# Patient Record
Sex: Female | Born: 2000 | Hispanic: Yes | Marital: Single | Smoking: Never smoker
Health system: Southern US, Community
[De-identification: ages and names within clinical notes are randomized; demographics above are authoritative.]

## PROBLEM LIST (undated history)

## (undated) DIAGNOSIS — D696 Thrombocytopenia, unspecified: Secondary | ICD-10-CM

## (undated) DIAGNOSIS — R51 Headache: Secondary | ICD-10-CM

## (undated) HISTORY — PX: NO PAST SURGERIES: SHX2092

## (undated) HISTORY — DX: Headache: R51

---
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
## 2020-09-02 ENCOUNTER — Ambulatory Visit (INDEPENDENT_AMBULATORY_CARE_PROVIDER_SITE_OTHER): Payer: Medicaid Other | Admitting: Family

## 2020-09-02 ENCOUNTER — Encounter: Payer: Self-pay | Admitting: Family

## 2020-09-02 ENCOUNTER — Other Ambulatory Visit (HOSPITAL_COMMUNITY)
Admission: RE | Admit: 2020-09-02 | Discharge: 2020-09-02 | Disposition: A | Discharge status: Home / Self Care | Payer: Medicaid Other | Source: Ambulatory Visit | Attending: Family | Admitting: Family

## 2020-09-02 ENCOUNTER — Other Ambulatory Visit: Payer: Self-pay

## 2020-09-02 VITALS — BP 108/73 | HR 76 | Wt 147.0 lb

## 2020-09-02 DIAGNOSIS — Z3009 Encounter for other general counseling and advice on contraception: Secondary | ICD-10-CM | POA: Insufficient documentation

## 2020-09-02 DIAGNOSIS — Z3201 Encounter for pregnancy test, result positive: Secondary | ICD-10-CM

## 2020-09-02 DIAGNOSIS — R102 Pelvic and perineal pain: Secondary | ICD-10-CM

## 2020-09-02 LAB — POCT URINALYSIS DIP (DEVICE)
Bilirubin Urine: NEGATIVE
Glucose, UA: NEGATIVE mg/dL
Hgb urine dipstick: NEGATIVE
Ketones, ur: NEGATIVE mg/dL
Leukocytes,Ua: NEGATIVE
Nitrite: NEGATIVE
Protein, ur: NEGATIVE mg/dL
Specific Gravity, Urine: >=1.03 (ref 1.005–1.030)
Urobilinogen, UA: 0.2 mg/dL (ref 0.0–1.0)
pH: 6 (ref 5.0–8.0)

## 2020-09-02 LAB — POCT PREGNANCY, URINE: Preg Test, Ur: NEGATIVE

## 2020-09-02 MED ORDER — NORETHIN ACE-ETH ESTRAD-FE 1-20 MG-MCG PO TABS: 1.0000 | ORAL_TABLET | Freq: Every day | ORAL | 11 refills | Status: AC

## 2020-09-02 NOTE — Progress Notes (Signed)
History:  Breanna Delgado is a 20 y.o. No obstetric history on file. who presents to clinic today for dysuria x 1 week.  Sexually active x 8 months with same partner.  Denies abnormal vaginal discharge, itching, or odor.  Desires oral contraception after review of the the various methods.  No history of screening for STIs.  Declined HIV.    The following portions of the patient's history were reviewed and updated as appropriate: allergies, current medications, family history, past medical history, social history, past surgical history and problem list.  Review of Systems:  Review of Systems  Constitutional:  Negative for fever.  Gastrointestinal:  Negative for nausea and vomiting.  Genitourinary:  Positive for dysuria and urgency. Negative for vaginal bleeding, vaginal discharge and vaginal pain.      Objective:  Physical Exam BP 108/73   Pulse 76   Wt 147 lb (66.7 kg)   LMP 08/15/2020   BMI 26.89 kg/m  Physical Exam Constitutional:      Appearance: She is well-developed.  HENT:     Head: Normocephalic.  Pulmonary:     Effort: Pulmonary effort is normal.  Abdominal:     Palpations: Abdomen is soft. There is no mass.     Tenderness: There is no abdominal tenderness. There is no guarding.  Genitourinary:    Vagina: No vaginal discharge.  Musculoskeletal:     Cervical back: Normal range of motion and neck supple.  Skin:    General: Skin is warm and dry.  Neurological:     Mental Status: She is alert and oriented to person, place, and time.     Deep Tendon Reflexes: Reflexes are normal and symmetric.     Labs and Imaging Results for orders placed or performed in visit on 09/02/20 (from the past 24 hour(s))  POCT urinalysis dip (device)     Status: None   Collection Time: 09/02/20  9:09 AM  Result Value Ref Range   Glucose, UA NEGATIVE NEGATIVE mg/dL   Bilirubin Urine NEGATIVE NEGATIVE   Ketones, ur NEGATIVE NEGATIVE mg/dL   Specific Gravity, Urine >=1.030  1.005 - 1.030   Hgb urine dipstick NEGATIVE NEGATIVE   pH 6.0 5.0 - 8.0   Protein, ur NEGATIVE NEGATIVE mg/dL   Urobilinogen, UA 0.2 0.0 - 1.0 mg/dL   Nitrite NEGATIVE NEGATIVE   Leukocytes,Ua NEGATIVE NEGATIVE  Pregnancy, urine POC     Status: None   Collection Time: 09/02/20  9:12 AM  Result Value Ref Range   Preg Test, Ur NEGATIVE NEGATIVE    No results found.   Assessment & Plan:  1. Pelvic pain - Cervicovaginal ancillary only( [place]) - UA negative  2. Family planning counseling - Reviewed PISC and methods of contraception including side effects - norethindrone-ethinyl estradiol-FE (JUNEL FE 1/20) 1-20 MG-MCG tablet; Take 1 tablet by mouth daily.  Dispense: 28 tablet; Refill: 11 - Advised back-up method   Amedeo Gory, CNM 09/02/2020 9:36 AM

## 2020-09-04 LAB — CERVICOVAGINAL ANCILLARY ONLY
Bacterial Vaginitis (gardnerella): NEGATIVE
Candida Glabrata: NEGATIVE
Candida Vaginitis: NEGATIVE
Chlamydia: NEGATIVE
Comment: NEGATIVE
Comment: NEGATIVE
Comment: NEGATIVE
Comment: NEGATIVE
Comment: NEGATIVE
Comment: NORMAL
Neisseria Gonorrhea: NEGATIVE
Trichomonas: NEGATIVE

## 2020-09-04 NOTE — Progress Notes (Signed)
All labs normal.  No further action needed.  Please reach out if you have any additional questions.

## 2022-06-29 ENCOUNTER — Ambulatory Visit: Payer: 59 | Admitting: Family Medicine

## 2022-06-29 ENCOUNTER — Ambulatory Visit: Payer: Self-pay

## 2022-06-29 ENCOUNTER — Encounter: Payer: Self-pay | Admitting: Family Medicine

## 2022-06-29 VITALS — BP 111/63 | Ht 62.0 in | Wt 150.0 lb

## 2022-06-29 DIAGNOSIS — M25572 Pain in left ankle and joints of left foot: Secondary | ICD-10-CM

## 2022-06-29 DIAGNOSIS — M25571 Pain in right ankle and joints of right foot: Secondary | ICD-10-CM | POA: Diagnosis not present

## 2022-06-29 MED ORDER — KETOROLAC TROMETHAMINE 60 MG/2ML IM SOLN
60.0000 mg | Freq: Once | INTRAMUSCULAR | Status: AC
  Administered 2022-06-29: 60 mg via INTRAMUSCULAR

## 2022-06-29 NOTE — Patient Instructions (Signed)
[facility] [address] [phone]  Dr Marton Redwood Althea Charon

## 2022-06-29 NOTE — Assessment & Plan Note (Signed)
Patient is clinical and ultrasound evidence of possible medial lateral malleolus fracture.  At her mother's request she was sent to orthopedics for x-rays.  Referral was placed today.  She was placed in a cam walker boot with an Ace bandage and given crutches for ambulation, recommend she not weight-bear until she has her x-rays and they have been reviewed by Ortho.  She was also given 60 mg of Toradol IM to help with her pain.  Follow-up with orthopedics.

## 2022-06-29 NOTE — Progress Notes (Signed)
[facility]: Attending Note: I have examined the patient, reviewed the chart, discussed the assessment and plan with the Sports Medicine Fellow. I agree with assessment and treatment plan as detailed in the Fellow's note. Likely  ankle fracture. NWB w crutches, we have arranged appt with ortho this afternoon.  Discussed at length with her and her Mom.  We have sent copy of [place] pics to Dr. Althea Charon, placed her in boot, crutches. Explaioned US findings and our concern for fracture of both bones of lower leg. She is also having some mild pain right ankle but can walk on it (likely sprain), no point tenderness.

## 2022-06-29 NOTE — Progress Notes (Signed)
    New Patient Office Visit  Subjective   Patient ID: Breanna Delgado, female    DOB: 2000-07-04  Age: 22 y.o. MRN: [id number]  Left ankle pain.  Patient is here today with chief complaint of left ankle pain after she fell down the stairs this morning around 8 AM.  She reports she slipped and missed a step and had a twisting injury of her ankle.  She has not been able to bear weight since the injury this morning.  She put some ice on her ankle and took some naproxen and then presented here.  She is here today hobbling using a wheelchair to get into the building.  She is quite tearful and uncomfortable during the exam.  She has injured this ankle in the past but not any fractures.   ROS as listed above in HPI    Objective:     BP 111/63   Ht 5\' 2"  (1.575 m)   Wt 150 lb (68 kg)   BMI 27.44 kg/m   Physical Exam Vitals reviewed.  Constitutional:      General: She is not in acute distress.    Appearance: Normal appearance. She is not ill-appearing, toxic-appearing or diaphoretic.     Comments: Appears uncomfortable and tearful throughout exam  Pulmonary:     Effort: Pulmonary effort is normal.  Musculoskeletal:        General: Swelling (Medial and lateral ankle), tenderness and signs of injury present.  Skin:    General: Skin is warm.  Neurological:     Mental Status: She is alert.   Left ankle: She has some obvious swelling around her lateral ankle.  Tenderness to palpation of the lateral malleolus, medial malleolus and anterior ankle.  No tenderness to palpation of the base of the fifth.  Decreased range of motion secondary to pain with plantarflexion, dorsiflexion inversion and eversion.  Dorsalis pedis 2+.  Limited ultrasound: Left ankle She has some cortical irregularity seen in both long axis and short axis of her medial and lateral malleolus.  Concern for fracture    Assessment & Plan:   Problem List Items Addressed This Visit       Other   Pain in joint,  ankle and foot - Primary    Patient is clinical and ultrasound evidence of possible medial lateral malleolus fracture.  At her mother's request she was sent to orthopedics for x-rays.  Referral was placed today.  She was placed in a cam walker boot with an Ace bandage and given crutches for ambulation, recommend she not weight-bear until she has her x-rays and they have been reviewed by Ortho.  She was also given 60 mg of Toradol IM to help with her pain.  Follow-up with orthopedics.      Relevant Orders   DG Ankle Complete Right   [place] LIMITED JOINT SPACE STRUCTURES LOW LEFT   DG Ankle Complete Left    Return if symptoms worsen or fail to improve.    Claudie Leach, DO

## 2022-07-02 DIAGNOSIS — S82842A Displaced bimalleolar fracture of left lower leg, initial encounter for closed fracture: Secondary | ICD-10-CM | POA: Diagnosis not present

## 2022-07-02 DIAGNOSIS — M25571 Pain in right ankle and joints of right foot: Secondary | ICD-10-CM | POA: Diagnosis not present

## 2022-07-12 DIAGNOSIS — X58XXXA Exposure to other specified factors, initial encounter: Secondary | ICD-10-CM | POA: Diagnosis not present

## 2022-07-12 DIAGNOSIS — S82852A Displaced trimalleolar fracture of left lower leg, initial encounter for closed fracture: Secondary | ICD-10-CM | POA: Diagnosis not present

## 2022-07-12 DIAGNOSIS — Y999 Unspecified external cause status: Secondary | ICD-10-CM | POA: Diagnosis not present

## 2022-07-30 DIAGNOSIS — S82852A Displaced trimalleolar fracture of left lower leg, initial encounter for closed fracture: Secondary | ICD-10-CM | POA: Diagnosis not present

## 2022-08-27 DIAGNOSIS — S82852A Displaced trimalleolar fracture of left lower leg, initial encounter for closed fracture: Secondary | ICD-10-CM | POA: Diagnosis not present

## 2022-08-30 DIAGNOSIS — M25672 Stiffness of left ankle, not elsewhere classified: Secondary | ICD-10-CM | POA: Diagnosis not present

## 2022-08-30 DIAGNOSIS — S82852D Displaced trimalleolar fracture of left lower leg, subsequent encounter for closed fracture with routine healing: Secondary | ICD-10-CM | POA: Diagnosis not present

## 2022-09-04 DIAGNOSIS — H1045 Other chronic allergic conjunctivitis: Secondary | ICD-10-CM | POA: Diagnosis not present

## 2022-09-04 DIAGNOSIS — H5213 Myopia, bilateral: Secondary | ICD-10-CM | POA: Diagnosis not present

## 2022-09-04 DIAGNOSIS — H0288B Meibomian gland dysfunction left eye, upper and lower eyelids: Secondary | ICD-10-CM | POA: Diagnosis not present

## 2022-09-04 DIAGNOSIS — H0288A Meibomian gland dysfunction right eye, upper and lower eyelids: Secondary | ICD-10-CM | POA: Diagnosis not present

## 2022-09-05 DIAGNOSIS — M25672 Stiffness of left ankle, not elsewhere classified: Secondary | ICD-10-CM | POA: Diagnosis not present

## 2022-09-05 DIAGNOSIS — S82852D Displaced trimalleolar fracture of left lower leg, subsequent encounter for closed fracture with routine healing: Secondary | ICD-10-CM | POA: Diagnosis not present

## 2022-09-06 DIAGNOSIS — M25672 Stiffness of left ankle, not elsewhere classified: Secondary | ICD-10-CM | POA: Diagnosis not present

## 2022-09-06 DIAGNOSIS — S82852D Displaced trimalleolar fracture of left lower leg, subsequent encounter for closed fracture with routine healing: Secondary | ICD-10-CM | POA: Diagnosis not present

## 2022-09-10 DIAGNOSIS — M25672 Stiffness of left ankle, not elsewhere classified: Secondary | ICD-10-CM | POA: Diagnosis not present

## 2022-09-10 DIAGNOSIS — S82852D Displaced trimalleolar fracture of left lower leg, subsequent encounter for closed fracture with routine healing: Secondary | ICD-10-CM | POA: Diagnosis not present

## 2022-09-12 DIAGNOSIS — S82852D Displaced trimalleolar fracture of left lower leg, subsequent encounter for closed fracture with routine healing: Secondary | ICD-10-CM | POA: Diagnosis not present

## 2022-09-12 DIAGNOSIS — M25672 Stiffness of left ankle, not elsewhere classified: Secondary | ICD-10-CM | POA: Diagnosis not present

## 2022-09-19 DIAGNOSIS — M25672 Stiffness of left ankle, not elsewhere classified: Secondary | ICD-10-CM | POA: Diagnosis not present

## 2022-09-19 DIAGNOSIS — S82852D Displaced trimalleolar fracture of left lower leg, subsequent encounter for closed fracture with routine healing: Secondary | ICD-10-CM | POA: Diagnosis not present

## 2022-09-20 DIAGNOSIS — S82852D Displaced trimalleolar fracture of left lower leg, subsequent encounter for closed fracture with routine healing: Secondary | ICD-10-CM | POA: Diagnosis not present

## 2022-09-20 DIAGNOSIS — M25672 Stiffness of left ankle, not elsewhere classified: Secondary | ICD-10-CM | POA: Diagnosis not present

## 2022-09-24 DIAGNOSIS — S82852D Displaced trimalleolar fracture of left lower leg, subsequent encounter for closed fracture with routine healing: Secondary | ICD-10-CM | POA: Diagnosis not present

## 2022-09-24 DIAGNOSIS — M25672 Stiffness of left ankle, not elsewhere classified: Secondary | ICD-10-CM | POA: Diagnosis not present

## 2022-09-26 DIAGNOSIS — S82852D Displaced trimalleolar fracture of left lower leg, subsequent encounter for closed fracture with routine healing: Secondary | ICD-10-CM | POA: Diagnosis not present

## 2022-09-26 DIAGNOSIS — M25672 Stiffness of left ankle, not elsewhere classified: Secondary | ICD-10-CM | POA: Diagnosis not present

## 2022-10-08 DIAGNOSIS — M25572 Pain in left ankle and joints of left foot: Secondary | ICD-10-CM | POA: Diagnosis not present

## 2022-10-08 DIAGNOSIS — S82842A Displaced bimalleolar fracture of left lower leg, initial encounter for closed fracture: Secondary | ICD-10-CM | POA: Diagnosis not present

## 2022-10-10 DIAGNOSIS — S82852D Displaced trimalleolar fracture of left lower leg, subsequent encounter for closed fracture with routine healing: Secondary | ICD-10-CM | POA: Diagnosis not present

## 2022-10-10 DIAGNOSIS — M25672 Stiffness of left ankle, not elsewhere classified: Secondary | ICD-10-CM | POA: Diagnosis not present

## 2022-10-12 DIAGNOSIS — M25672 Stiffness of left ankle, not elsewhere classified: Secondary | ICD-10-CM | POA: Diagnosis not present

## 2022-10-12 DIAGNOSIS — S82852D Displaced trimalleolar fracture of left lower leg, subsequent encounter for closed fracture with routine healing: Secondary | ICD-10-CM | POA: Diagnosis not present

## 2022-10-16 DIAGNOSIS — M25672 Stiffness of left ankle, not elsewhere classified: Secondary | ICD-10-CM | POA: Diagnosis not present

## 2022-10-16 DIAGNOSIS — S82852D Displaced trimalleolar fracture of left lower leg, subsequent encounter for closed fracture with routine healing: Secondary | ICD-10-CM | POA: Diagnosis not present

## 2022-10-18 DIAGNOSIS — M25672 Stiffness of left ankle, not elsewhere classified: Secondary | ICD-10-CM | POA: Diagnosis not present

## 2022-10-18 DIAGNOSIS — S82852D Displaced trimalleolar fracture of left lower leg, subsequent encounter for closed fracture with routine healing: Secondary | ICD-10-CM | POA: Diagnosis not present

## 2022-11-14 DIAGNOSIS — M25672 Stiffness of left ankle, not elsewhere classified: Secondary | ICD-10-CM | POA: Diagnosis not present

## 2022-11-14 DIAGNOSIS — S82852D Displaced trimalleolar fracture of left lower leg, subsequent encounter for closed fracture with routine healing: Secondary | ICD-10-CM | POA: Diagnosis not present

## 2022-11-20 DIAGNOSIS — S82852D Displaced trimalleolar fracture of left lower leg, subsequent encounter for closed fracture with routine healing: Secondary | ICD-10-CM | POA: Diagnosis not present

## 2022-11-20 DIAGNOSIS — M25672 Stiffness of left ankle, not elsewhere classified: Secondary | ICD-10-CM | POA: Diagnosis not present

## 2022-11-26 DIAGNOSIS — T8484XA Pain due to internal orthopedic prosthetic devices, implants and grafts, initial encounter: Secondary | ICD-10-CM | POA: Diagnosis not present

## 2022-11-26 DIAGNOSIS — M25672 Stiffness of left ankle, not elsewhere classified: Secondary | ICD-10-CM | POA: Diagnosis not present

## 2022-11-27 DIAGNOSIS — M25672 Stiffness of left ankle, not elsewhere classified: Secondary | ICD-10-CM | POA: Diagnosis not present

## 2022-11-27 DIAGNOSIS — S82852D Displaced trimalleolar fracture of left lower leg, subsequent encounter for closed fracture with routine healing: Secondary | ICD-10-CM | POA: Diagnosis not present

## 2022-12-13 DIAGNOSIS — M25672 Stiffness of left ankle, not elsewhere classified: Secondary | ICD-10-CM | POA: Diagnosis not present

## 2022-12-13 DIAGNOSIS — S82852D Displaced trimalleolar fracture of left lower leg, subsequent encounter for closed fracture with routine healing: Secondary | ICD-10-CM | POA: Diagnosis not present

## 2022-12-21 DIAGNOSIS — M25672 Stiffness of left ankle, not elsewhere classified: Secondary | ICD-10-CM | POA: Diagnosis not present

## 2022-12-21 DIAGNOSIS — S82852D Displaced trimalleolar fracture of left lower leg, subsequent encounter for closed fracture with routine healing: Secondary | ICD-10-CM | POA: Diagnosis not present

## 2022-12-26 DIAGNOSIS — M25672 Stiffness of left ankle, not elsewhere classified: Secondary | ICD-10-CM | POA: Diagnosis not present

## 2023-01-03 DIAGNOSIS — S82852D Displaced trimalleolar fracture of left lower leg, subsequent encounter for closed fracture with routine healing: Secondary | ICD-10-CM | POA: Diagnosis not present

## 2023-01-03 DIAGNOSIS — M25672 Stiffness of left ankle, not elsewhere classified: Secondary | ICD-10-CM | POA: Diagnosis not present

## 2023-08-06 DIAGNOSIS — Z113 Encounter for screening for infections with a predominantly sexual mode of transmission: Secondary | ICD-10-CM | POA: Diagnosis not present

## 2023-08-06 DIAGNOSIS — Z3201 Encounter for pregnancy test, result positive: Secondary | ICD-10-CM | POA: Diagnosis not present

## 2023-08-06 DIAGNOSIS — Z124 Encounter for screening for malignant neoplasm of cervix: Secondary | ICD-10-CM | POA: Diagnosis not present

## 2023-08-06 DIAGNOSIS — Z3480 Encounter for supervision of other normal pregnancy, unspecified trimester: Secondary | ICD-10-CM | POA: Diagnosis not present

## 2023-08-06 DIAGNOSIS — N925 Other specified irregular menstruation: Secondary | ICD-10-CM | POA: Diagnosis not present

## 2023-09-03 DIAGNOSIS — Z31430 Encounter of female for testing for genetic disease carrier status for procreative management: Secondary | ICD-10-CM | POA: Diagnosis not present

## 2023-09-03 DIAGNOSIS — Z3480 Encounter for supervision of other normal pregnancy, unspecified trimester: Secondary | ICD-10-CM | POA: Diagnosis not present

## 2023-09-03 DIAGNOSIS — Z3481 Encounter for supervision of other normal pregnancy, first trimester: Secondary | ICD-10-CM | POA: Diagnosis not present

## 2023-10-04 DIAGNOSIS — Z3482 Encounter for supervision of other normal pregnancy, second trimester: Secondary | ICD-10-CM | POA: Diagnosis not present

## 2023-10-04 DIAGNOSIS — Z3480 Encounter for supervision of other normal pregnancy, unspecified trimester: Secondary | ICD-10-CM | POA: Diagnosis not present

## 2023-10-24 DIAGNOSIS — Z3A19 19 weeks gestation of pregnancy: Secondary | ICD-10-CM | POA: Diagnosis not present

## 2023-10-24 DIAGNOSIS — Z363 Encounter for antenatal screening for malformations: Secondary | ICD-10-CM | POA: Diagnosis not present

## 2023-10-24 DIAGNOSIS — T148XXA Other injury of unspecified body region, initial encounter: Secondary | ICD-10-CM | POA: Diagnosis not present

## 2023-12-20 DIAGNOSIS — Z23 Encounter for immunization: Secondary | ICD-10-CM | POA: Diagnosis not present

## 2023-12-20 DIAGNOSIS — Z3480 Encounter for supervision of other normal pregnancy, unspecified trimester: Secondary | ICD-10-CM | POA: Diagnosis not present

## 2023-12-23 ENCOUNTER — Encounter: Payer: Self-pay | Admitting: Neurology

## 2023-12-25 DIAGNOSIS — O99810 Abnormal glucose complicating pregnancy: Secondary | ICD-10-CM | POA: Diagnosis not present

## 2023-12-30 DIAGNOSIS — R300 Dysuria: Secondary | ICD-10-CM | POA: Diagnosis not present

## 2024-04-14 ENCOUNTER — Ambulatory Visit: Payer: Self-pay | Admitting: Neurology
